# Patient Record
Sex: Male | Born: 1975 | Race: Black or African American | Hispanic: No | Marital: Married | Smoking: Never smoker
Health system: Southern US, Community
[De-identification: ages and names within clinical notes are randomized; demographics above are authoritative.]

## PROBLEM LIST (undated history)

## (undated) DIAGNOSIS — I10 Essential (primary) hypertension: Secondary | ICD-10-CM

## (undated) DIAGNOSIS — A6000 Herpesviral infection of urogenital system, unspecified: Secondary | ICD-10-CM

## (undated) HISTORY — PX: OTHER SURGICAL HISTORY: SHX169

---

## 2014-09-09 ENCOUNTER — Encounter: Payer: Self-pay | Admitting: Gynecology

## 2014-09-09 ENCOUNTER — Ambulatory Visit
Admission: EM | Admit: 2014-09-09 | Discharge: 2014-09-09 | Disposition: A | Discharge status: Home / Self Care | Payer: Self-pay | Attending: Emergency Medicine | Admitting: Emergency Medicine

## 2014-09-09 DIAGNOSIS — Z0000 Encounter for general adult medical examination without abnormal findings: Secondary | ICD-10-CM

## 2014-09-09 DIAGNOSIS — Z029 Encounter for administrative examinations, unspecified: Secondary | ICD-10-CM

## 2014-09-09 LAB — DEPT OF TRANSP DIPSTICK, URINE (ARMC ONLY)
GLUCOSE, UA: NEGATIVE mg/dL
Protein, ur: NEGATIVE mg/dL
Specific Gravity, Urine: 1.02 (ref 1.005–1.030)

## 2014-09-09 NOTE — Discharge Instructions (Signed)
Low-Sodium Eating Plan °Sodium raises blood pressure and causes water to be held in the body. Getting less sodium from food will help lower your blood pressure, reduce any swelling, and protect your heart, liver, and kidneys. We get sodium by adding salt (sodium chloride) to food. Most of our sodium comes from canned, boxed, and frozen foods. Restaurant foods, fast foods, and pizza are also very high in sodium. Even if you take medicine to lower your blood pressure or to reduce fluid in your body, getting less sodium from your food is important. °WHAT IS MY PLAN? °Most people should limit their sodium intake to 2,300 mg a day. Your health care provider recommends that you limit your sodium intake to __________ a day.  °WHAT DO I NEED TO KNOW ABOUT THIS EATING PLAN? °For the low-sodium eating plan, you will follow these general guidelines: °· Choose foods with a % Daily Value for sodium of less than 5% (as listed on the food label).   °· Use salt-free seasonings or herbs instead of table salt or sea salt.   °· Check with your health care provider or pharmacist before using salt substitutes.   °· Eat fresh foods. °· Eat more vegetables and fruits. °· Limit canned vegetables. If you do use them, rinse them well to decrease the sodium.   °· Limit cheese to 1 oz (28 g) per day.    °· Eat lower-sodium products, often labeled as "lower sodium" or "no salt added." °· Avoid foods that contain monosodium glutamate (MSG). MSG is sometimes added to Chinese food and some canned foods.   °· Check food labels (Nutrition Facts labels) on foods to learn how much sodium is in one serving. °· Eat more home-cooked food and less restaurant, buffet, and fast food.  °· When eating at a restaurant, ask that your food be prepared with less salt or none, if possible.   °HOW DO I READ FOOD LABELS FOR SODIUM INFORMATION? °The Nutrition Facts label lists the amount of sodium in one serving of the food. If you eat more than one serving, you must  multiply the listed amount of sodium by the number of servings. °Food labels may also identify foods as: °· Sodium free--Less than 5 mg in a serving. °· Very low sodium--35 mg or less in a serving. °· Low sodium--140 mg or less in a serving. °· Light in sodium--50% less sodium in a serving. For example, if a food that usually has 300 mg of sodium is changed to become light in sodium, it will have 150 mg of sodium. °· Reduced sodium--25% less sodium in a serving. For example, if a food that usually has 400 mg of sodium is changed to reduced sodium, it will have 300 mg of sodium. °WHAT FOODS CAN I EAT? °Grains  °Low-sodium cereals, including oats, puffed wheat and rice, and shredded wheat cereals. Low-sodium crackers. Unsalted rice and pasta. Lower-sodium bread.  °Vegetables  °Frozen or fresh vegetables. Low-sodium or reduced-sodium canned vegetables. Low-sodium or reduced-sodium tomato sauce and paste. Low-sodium or reduced-sodium tomato and vegetable juices.  °Fruits  °Fresh, frozen, and canned fruit. Fruit juice.  °Meat and Other Protein Products  °Low-sodium canned tuna and salmon. Fresh or frozen meat, poultry, seafood, and fish. Lamb. Unsalted nuts. Dried beans, peas, and lentils without added salt. Unsalted canned beans. Homemade soups without salt. Eggs.  °Dairy  °Milk. Soy milk. Ricotta cheese. Low-sodium or reduced-sodium cheeses. Yogurt.  °Condiments  °Fresh and dried herbs and spices. Salt-free seasonings. Onion and garlic powders. Low-sodium varieties of mustard and ketchup. Lemon juice.  °Fats and Oils   °  Reduced-sodium salad dressings. Unsalted butter.  Other Unsalted popcorn and pretzels.  The items listed above may not be a complete list of recommended foods or beverages. Contact your dietitian for more options. WHAT FOODS ARE NOT RECOMMENDED? Grains Instant hot cereals. Bread stuffing, pancake, and biscuit mixes. Croutons. Seasoned rice or pasta mixes. Noodle soup cups. Boxed or frozen  macaroni and cheese. Self-rising flour. Regular salted crackers. Vegetables Regular canned vegetables. Regular canned tomato sauce and paste. Regular tomato and vegetable juices. Frozen vegetables in sauces. Salted french fries. Olives. Rosita Fire. Relishes. Sauerkraut. Salsa. Meat and Other Protein Products Salted, canned, smoked, spiced, or pickled meats, seafood, or fish. Bacon, ham, sausage, hot dogs, corned beef, chipped beef, and packaged luncheon meats. Salt pork. Jerky. Pickled herring. Anchovies, regular canned tuna, and sardines. Salted nuts. Dairy Processed cheese and cheese spreads. Cheese curds. Blue cheese and cottage cheese. Buttermilk.  Condiments Onion and garlic salt, seasoned salt, table salt, and sea salt. Canned and packaged gravies. Worcestershire sauce. Tartar sauce. Barbecue sauce. Teriyaki sauce. Soy sauce, including reduced sodium. Steak sauce. Fish sauce. Oyster sauce. Cocktail sauce. Horseradish. Regular ketchup and mustard. Meat flavorings and tenderizers. Bouillon cubes. Hot sauce. Tabasco sauce. Marinades. Taco seasonings. Relishes. Fats and Oils Regular salad dressings. Salted butter. Margarine. Ghee. Bacon fat.  Other Potato and tortilla chips. Corn chips and puffs. Salted popcorn and pretzels. Canned or dried soups. Pizza. Frozen entrees and pot pies.  The items listed above may not be a complete list of foods and beverages to avoid. Contact your dietitian for more information. Document Released: 07/18/2001 Document Revised: 01/31/2013 Document Reviewed: 11/30/2012 [facility] Patient Information 2015 [place], [place]. This information is not intended to replace advice given to you by your health care provider. Make sure you discuss any questions you have with your health care provider. DASH Eating Plan DASH stands for "Dietary Approaches to Stop Hypertension." The DASH eating plan is a healthy eating plan that has been shown to reduce high blood pressure  (hypertension). Additional health benefits may include reducing the risk of type 2 diabetes mellitus, heart disease, and stroke. The DASH eating plan may also help with weight loss. WHAT DO I NEED TO KNOW ABOUT THE DASH EATING PLAN? For the DASH eating plan, you will follow these general guidelines:  Choose foods with a percent daily value for sodium of less than 5% (as listed on the food label).  Use salt-free seasonings or herbs instead of table salt or sea salt.  Check with your health care provider or pharmacist before using salt substitutes.  Eat lower-sodium products, often labeled as "lower sodium" or "no salt added."  Eat fresh foods.  Eat more vegetables, fruits, and low-fat dairy products.  Choose whole grains. Look for the word "whole" as the first word in the ingredient list.  Choose fish and skinless chicken or [place] more often than red meat. Limit fish, poultry, and meat to 6 oz (170 g) each day.  Limit sweets, desserts, sugars, and sugary drinks.  Choose heart-healthy fats.  Limit cheese to 1 oz (28 g) per day.  Eat more home-cooked food and less restaurant, buffet, and fast food.  Limit fried foods.  Cook foods using methods other than frying.  Limit canned vegetables. If you do use them, rinse them well to decrease the sodium.  When eating at a restaurant, ask that your food be prepared with less salt, or no salt if possible. WHAT FOODS CAN I EAT? Seek help from a dietitian for individual calorie  needs. Grains Whole grain or whole wheat bread. Brown rice. Whole grain or whole wheat pasta. Quinoa, bulgur, and whole grain cereals. Low-sodium cereals. Corn or whole wheat flour tortillas. Whole grain cornbread. Whole grain crackers. Low-sodium crackers. Vegetables Fresh or frozen vegetables (raw, steamed, roasted, or grilled). Low-sodium or reduced-sodium tomato and vegetable juices. Low-sodium or reduced-sodium tomato sauce and paste. Low-sodium or  reduced-sodium canned vegetables.  Fruits All fresh, canned (in natural juice), or frozen fruits. Meat and Other Protein Products Ground beef (85% or leaner), grass-fed beef, or beef trimmed of fat. Skinless chicken or [place]. Ground chicken or [place]. Pork trimmed of fat. All fish and seafood. Eggs. Dried beans, peas, or lentils. Unsalted nuts and seeds. Unsalted canned beans. Dairy Low-fat dairy products, such as skim or 1% milk, 2% or reduced-fat cheeses, low-fat ricotta or cottage cheese, or plain low-fat yogurt. Low-sodium or reduced-sodium cheeses. Fats and Oils Tub margarines without trans fats. Light or reduced-fat mayonnaise and salad dressings (reduced sodium). Avocado. Safflower, olive, or canola oils. Natural peanut or almond butter. Other Unsalted popcorn and pretzels. The items listed above may not be a complete list of recommended foods or beverages. Contact your dietitian for more options. WHAT FOODS ARE NOT RECOMMENDED? Grains White bread. White pasta. White rice. Refined cornbread. Bagels and croissants. Crackers that contain trans fat. Vegetables Creamed or fried vegetables. Vegetables in a cheese sauce. Regular canned vegetables. Regular canned tomato sauce and paste. Regular tomato and vegetable juices. Fruits Dried fruits. Canned fruit in light or heavy syrup. Fruit juice. Meat and Other Protein Products Fatty cuts of meat. Ribs, chicken wings, bacon, sausage, bologna, salami, chitterlings, fatback, hot dogs, bratwurst, and packaged luncheon meats. Salted nuts and seeds. Canned beans with salt. Dairy Whole or 2% milk, cream, half-and-half, and cream cheese. Whole-fat or sweetened yogurt. Full-fat cheeses or blue cheese. Nondairy creamers and whipped toppings. Processed cheese, cheese spreads, or cheese curds. Condiments Onion and garlic salt, seasoned salt, table salt, and sea salt. Canned and packaged gravies. Worcestershire sauce. Tartar sauce. Barbecue sauce. Teriyaki  sauce. Soy sauce, including reduced sodium. Steak sauce. Fish sauce. Oyster sauce. Cocktail sauce. Horseradish. Ketchup and mustard. Meat flavorings and tenderizers. Bouillon cubes. Hot sauce. Tabasco sauce. Marinades. Taco seasonings. Relishes. Fats and Oils Butter, stick margarine, lard, shortening, ghee, and bacon fat. Coconut, palm kernel, or palm oils. Regular salad dressings. Other Pickles and olives. Salted popcorn and pretzels. The items listed above may not be a complete list of foods and beverages to avoid. Contact your dietitian for more information. WHERE CAN I FIND MORE INFORMATION? National Heart, [employer], and [employer] Institute: [employer] Document Released: 01/15/2011 Document Revised: 06/12/2013 Document Reviewed: 11/30/2012 [facility] Patient Information 2015 [place], [place]. This information is not intended to replace advice given to you by your health care provider. Make sure you discuss any questions you have with your health care provider.

## 2014-09-09 NOTE — ED Provider Notes (Signed)
CSN: [id number]     Arrival date & time 09/09/14  0802 History   First MD Initiated Contact with Patient 09/09/14 [phone]     Chief Complaint  Patient presents with  . 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 No history of BP elevation to his knowledge- mother and all maternal aunts have HTN   History reviewed. No pertinent past medical history. Past Surgical History  Procedure Laterality Date  . Widsom tooth extraction     No family history on file. History  Substance Use Topics  . Smoking status: Never Smoker   . Smokeless tobacco: Not on file  . Alcohol Use: Yes    Review of Systems Constitutional: No fever. No headaches Eyes: Has glasses-doesn't use to drive, has private DL without restriction  ENT:No sore throat. Cardiovascular:Negative for chest pain/palpitations Respiratory: Negative for shortness of breath Gastrointestinal: No abdominal pain. No nausea,vomiting, diarrhea Genitourinary: Negative for dysuria. Normal urination. Musculoskeletal: Negative for back pain. FROM extremities without pain Skin: Negative for rash Neurological: Negative for headache, focal weakness or numbness  Allergies  Review of patient's allergies indicates no known allergies.  Home Medications   Prior to Admission medications   Not on File   BP 158/93 mmHg  Pulse 69  Temp(Src) 98.3 F (36.8 C) (Oral)  Resp 20  Ht 6' (1.829 m)  Wt 287 lb (130.182 kg)  BMI 38.92 kg/m2  SpO2 97% Physical Exam   Constitutional -alert and oriented,well appearing and in no acute distress; overweight Head-atraumatic, normocephalic Eyes- conjunctiva normal, EOMI ,conjugate gaze- Acuity acceptable with glasses only Ears- canals and TMs neg Nose- no congestion  Mouth/throat-  mucous membranes moist ,oropharynx non-erythematous Neck- supple without glandular enlargement CV- regular rate, grossly normal heart sounds,  Resp-no distress, normal respiratory effort,clear to auscultation bilaterally GI- soft,non-tender,no distention, no scars GU- unremarkable, circumsized, no hernia noted MSK- no tender, normal ROM, all extremities, ambulatory, DTRs equal;  can toe walk, heel walk ,squat return without assistance. SLRs negative Neuro- normal speech and language, no gross focal neurological deficit appreciated, no gait instability, CNS WNL as tested Skin-warm,dry ,intact; no rash noted Psych-mood and affect grossly normal; speech and behavior grossly normal_.    Procedures (including critical care time) Labs Review Labs Reviewed  DEPT OF TRANSP DIPSTICK, URINE([facility] ONLY) - Abnormal; Notable for the following:    Hgb urine dipstick TRACE (*)    All other components within normal limits    Imaging Review No results found.  Have discussed elevated pressure noted . Patient very aware of dietary indiscretion since moving to the area. Reports weight gain- last knowledge 249 or so  Also has glasses but rarely wears them- Testing today reveals they are required at all times for DOT clearance Discussed with patient MDM   1. Physical exam    Limited clearance for 3 months for blood pressure management Needs to connect with PCP-- info given, has [employer] Encourage weight loss, salt restriction , DASH diet given  PLease refer to DOT paperwork  Scanned into system for additional information as needed    Rae Halsted, PA-C 09/09/14 1130

## 2014-09-09 NOTE — ED Notes (Signed)
DOT physical. 

## 2014-10-31 ENCOUNTER — Ambulatory Visit (INDEPENDENT_AMBULATORY_CARE_PROVIDER_SITE_OTHER): Payer: BLUE CROSS/BLUE SHIELD | Admitting: Family Medicine

## 2014-10-31 ENCOUNTER — Encounter: Payer: Self-pay | Admitting: Family Medicine

## 2014-10-31 VITALS — BP 130/102 | HR 86 | Ht 72.0 in | Wt 270.0 lb

## 2014-10-31 DIAGNOSIS — IMO0001 Reserved for inherently not codable concepts without codable children: Secondary | ICD-10-CM

## 2014-10-31 DIAGNOSIS — R030 Elevated blood-pressure reading, without diagnosis of hypertension: Secondary | ICD-10-CM | POA: Diagnosis not present

## 2014-10-31 DIAGNOSIS — Z23 Encounter for immunization: Secondary | ICD-10-CM | POA: Diagnosis not present

## 2014-10-31 DIAGNOSIS — A609 Anogenital herpesviral infection, unspecified: Secondary | ICD-10-CM | POA: Diagnosis not present

## 2014-10-31 DIAGNOSIS — E669 Obesity, unspecified: Secondary | ICD-10-CM | POA: Diagnosis not present

## 2014-10-31 DIAGNOSIS — A6002 Herpesviral infection of other male genital organs: Secondary | ICD-10-CM

## 2014-10-31 DIAGNOSIS — E66812 Obesity, class 2: Secondary | ICD-10-CM | POA: Insufficient documentation

## 2014-10-31 NOTE — Progress Notes (Signed)
Date:  10/31/2014   Name:  Manuel Ramirez   DOB:  [birth year]   MRN:  [id number]  PCP:  No PCP Per Patient    Chief Complaint: Hypertension   History of Present Illness:  This is a 40 y.o. male with elevated BP (158/93) at DOT PE 2 months ago, given 3 month license and DASH diet, has increased exercise and lost 17#, wants to avoid BP med. FH HTN in mother but no FH CAD/DM/stroke. Takes Valtrex prn every 4 months or so, no other rx or OTC meds, vits or supplements. Tetanus unknown, declines flu, lipids unknown.  Review of Systems:  Review of Systems  Constitutional: Negative for fever and chills.  HENT: Negative for ear pain and sore throat.   Eyes: Negative for pain.  Respiratory: Negative for cough and shortness of breath.   Cardiovascular: Negative for chest pain and leg swelling.  Gastrointestinal: Negative for abdominal pain.  Endocrine: Negative for polyuria.  Genitourinary: Negative for difficulty urinating.  Neurological: Negative for syncope and headaches.    There are no active problems to display for this patient.   Prior to Admission medications   Not on File    No Known Allergies  Past Surgical History  Procedure Laterality Date  . Widsom tooth extraction      Social History  Substance Use Topics  . Smoking status: Never Smoker   . Smokeless tobacco: None  . Alcohol Use: Yes    Family History  Problem Relation Age of Onset  . Hypertension Mother     Medication list has been reviewed and updated.  Physical Examination: BP 130/102 mmHg  Pulse 86  Ht 6' (1.829 m)  Wt 270 lb (122.471 kg)  BMI 36.61 kg/m2  Physical Exam  Constitutional: He is oriented to person, place, and time. He appears well-developed and well-nourished.  HENT:  Head: Normocephalic and atraumatic.  Left Ear: External ear normal.  Nose: Nose normal.  Mouth/Throat: Oropharynx is clear and moist.  Eyes: Conjunctivae and EOM are normal. Pupils are equal, round, and reactive to  light.  Neck: Neck supple. No thyromegaly present.  Cardiovascular: Normal rate, regular rhythm and normal heart sounds.   Pulmonary/Chest: Effort normal and breath sounds normal.  Abdominal: Soft. He exhibits no distension and no mass. There is no tenderness.  Musculoskeletal: He exhibits no edema.  Lymphadenopathy:    He has no cervical adenopathy.  Neurological: He is alert and oriented to person, place, and time. Coordination normal.  Skin: Skin is warm and dry.  Psychiatric: He has a normal mood and affect. His behavior is normal.  Nursing note and vitals reviewed.   Assessment and Plan:  1. Elevated BP Systolic improved, encouraged continued exercise/weight loss/DASH diet, check labs, wife (CNA) to check BP's at home, RTO 2 weeks, consider BP med then, needs controlled by end of October for DOT - Comprehensive metabolic panel - CBC  2. Obesity, Class II, BMI 35-39.9 Continue weight loss - Lipid Profile - TSH  3. Genital HSV Continue Valtrex prn  4. Need for tetanus imm Tdap today  Return in about 2 weeks (around 11/14/2014).  Dionne Ano. Kingsley Spittle MD [facility]  10/31/2014

## 2014-11-01 LAB — LIPID PANEL
Chol/HDL Ratio: 5 ratio units (ref 0.0–5.0)
Cholesterol, Total: 206 mg/dL — ABNORMAL HIGH (ref 100–199)
HDL: 41 mg/dL (ref >39)
LDL CALC: 144 mg/dL — AB (ref 0–99)
Triglycerides: 105 mg/dL (ref 0–149)
VLDL CHOLESTEROL CAL: 21 mg/dL (ref 5–40)

## 2014-11-01 LAB — COMPREHENSIVE METABOLIC PANEL
ALT: 17 IU/L (ref 0–44)
AST: 17 IU/L (ref 0–40)
Albumin/Globulin Ratio: 1.5 (ref 1.1–2.5)
Albumin: 4.5 g/dL (ref 3.5–5.5)
Alkaline Phosphatase: 71 IU/L (ref 39–117)
BUN/Creatinine Ratio: 13 (ref 9–20)
BUN: 15 mg/dL (ref 6–24)
Bilirubin Total: 0.4 mg/dL (ref 0.0–1.2)
CALCIUM: 9.8 mg/dL (ref 8.7–10.2)
CO2: 26 mmol/L (ref 18–29)
CREATININE: 1.17 mg/dL (ref 0.76–1.27)
Chloride: 97 mmol/L (ref 97–108)
GFR calc Af Amer: 90 mL/min/{1.73_m2} (ref >59)
GFR, EST NON AFRICAN AMERICAN: 78 mL/min/{1.73_m2} (ref >59)
Globulin, Total: 3 g/dL (ref 1.5–4.5)
Glucose: 92 mg/dL (ref 65–99)
Potassium: 4.4 mmol/L (ref 3.5–5.2)
Sodium: 140 mmol/L (ref 134–144)
Total Protein: 7.5 g/dL (ref 6.0–8.5)

## 2014-11-01 LAB — CBC
HEMATOCRIT: 48 % (ref 37.5–51.0)
HEMOGLOBIN: 16.7 g/dL (ref 12.6–17.7)
MCH: 30 pg (ref 26.6–33.0)
MCHC: 34.8 g/dL (ref 31.5–35.7)
MCV: 86 fL (ref 79–97)
Platelets: 275 10*3/uL (ref 150–379)
RBC: 5.56 x10E6/uL (ref 4.14–5.80)
RDW: 14.6 % (ref 12.3–15.4)
WBC: 3.9 10*3/uL (ref 3.4–10.8)

## 2014-11-01 LAB — TSH: TSH: 2.88 u[IU]/mL (ref 0.450–4.500)

## 2014-11-16 ENCOUNTER — Ambulatory Visit: Payer: Self-pay | Admitting: Family Medicine

## 2014-11-16 ENCOUNTER — Ambulatory Visit (INDEPENDENT_AMBULATORY_CARE_PROVIDER_SITE_OTHER): Payer: BLUE CROSS/BLUE SHIELD | Admitting: Family Medicine

## 2014-11-16 ENCOUNTER — Encounter: Payer: Self-pay | Admitting: Family Medicine

## 2014-11-16 VITALS — BP 130/100 | HR 64 | Ht 72.0 in | Wt 264.0 lb

## 2014-11-16 DIAGNOSIS — E669 Obesity, unspecified: Secondary | ICD-10-CM | POA: Diagnosis not present

## 2014-11-16 DIAGNOSIS — I10 Essential (primary) hypertension: Secondary | ICD-10-CM

## 2014-11-16 DIAGNOSIS — E785 Hyperlipidemia, unspecified: Secondary | ICD-10-CM | POA: Diagnosis not present

## 2014-11-16 MED ORDER — LISINOPRIL-HYDROCHLOROTHIAZIDE 10-12.5 MG PO TABS: 1.0000 | ORAL_TABLET | Freq: Every day | ORAL | Status: DC

## 2014-11-16 NOTE — Progress Notes (Signed)
Date:  11/16/2014   Name:  Manuel Ramirez   DOB:  September 09, 1975   MRN:  [id number]  PCP:  No PCP Per Patient    Chief Complaint: Hypertension   History of Present Illness:  This is a 39 y.o. male seen in f/u for elevated BP, has been following DASH diet and exercising more, has lost 6# but wife (CNA) says BP still up at home. Labs ok except mild HLD.  Review of Systems:  Review of Systems  Respiratory: Negative for shortness of breath.   Cardiovascular: Negative for chest pain and leg swelling.    Patient Active Problem List   Diagnosis Date Noted  . Essential hypertension 11/16/2014  . Hyperlipidemia 11/16/2014  . 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 Obesity, Class II, BMI 35-39.9 10/31/2014    Prior to Admission medications   Medication Sig Start Date End Date Taking? Authorizing Provider  lisinopril-hydrochlorothiazide (PRINZIDE,ZESTORETIC) 10-12.5 MG tablet Take 1 tablet by mouth daily. 11/16/14   Schuyler Amor, MD    No Known Allergies  Past Surgical History  Procedure Laterality Date  . Widsom tooth extraction      Social History  Substance Use Topics  . Smoking status: Never Smoker   . Smokeless tobacco: None  . Alcohol Use: 0.0 oz/week    0 Standard drinks or equivalent per week    Family History  Problem Relation Age of Onset  . Hypertension Mother     Medication list has been reviewed and updated.  Physical Examination: BP 130/100 mmHg  Pulse 64  Ht 6' (1.829 m)  Wt 264 lb (119.75 kg)  BMI 35.80 kg/m2  Physical Exam  Constitutional: He appears well-developed and well-nourished.  Cardiovascular: Normal rate, regular rhythm and normal heart sounds.   Pulmonary/Chest: Effort normal and breath sounds normal.  Musculoskeletal: He exhibits no edema.  Neurological: He is alert.  Skin: Skin is warm and dry.  Psychiatric: He has a normal mood and affect. His behavior is normal.  Nursing note and vitals reviewed.   Assessment and Plan:  1.  Essential hypertension Begin Prinzide, continue DASH diet and weight loss, hope to be able to d/c med in future  2. Obesity, Class II, BMI 35-39.9 Cont increased exercise/weight loss  3. Hyperlipidemia Ratio shows average risk only, should improve with further weight loss  Return in about 1 week (around 11/23/2014).   Needs BP controlled for DOT exam at end of month.  Dionne Ano. Kingsley Spittle MD [facility]  11/16/2014

## 2014-11-19 ENCOUNTER — Ambulatory Visit
Admission: EM | Admit: 2014-11-19 | Discharge: 2014-11-19 | Disposition: A | Discharge status: Home / Self Care | Payer: Self-pay

## 2014-11-19 ENCOUNTER — Encounter: Payer: Self-pay | Admitting: *Deleted

## 2014-11-19 DIAGNOSIS — I10 Essential (primary) hypertension: Secondary | ICD-10-CM

## 2014-11-19 HISTORY — DX: Essential (primary) hypertension: I10

## 2014-11-19 NOTE — ED Notes (Signed)
Here for follow up for bp check in order to pass DOT physical.  Established patient of Dr Hollace Hayward now and has been placed on Lisinopril hctz 10.12.5.  Has taken this medicine since Friday.

## 2014-11-20 ENCOUNTER — Encounter: Payer: Self-pay | Admitting: Physician Assistant

## 2014-11-20 NOTE — ED Provider Notes (Signed)
CSN: [id number]     Arrival date & time 11/19/14  1420 History   None    Chief Complaint  Patient presents with  . Hypertension   (Consider location/radiation/quality/duration/timing/severity/associated sxs/prior Treatment) HPI  39 yo M returns for 3 months BP check for DOT. Has been cared for by Dr Hollace Hayward.  Started on Lisinopril and did a great job at weight loss [phone]). Pressure was still running a little high at last PCP check and HCTZ was added with excellent response.  Feels great-loves the weight loss and plans to continue. Has hopes he can get weight low enough to be able to stop medications. Strongly encouraged NOT to do so unless specifically guided by his PCP. Denies having any difficulty with his meds-just  Would like to make a super improvement. Encouraged.  Past Medical History  Diagnosis Date  . Hypertension    Past Surgical History  Procedure Laterality Date  . Widsom tooth extraction     Family History  Problem Relation Age of Onset  . Hypertension Mother    Social History  Substance Use Topics  . Smoking status: Never Smoker   . Smokeless tobacco: None  . Alcohol Use: 0.0 oz/week    0 Standard drinks or equivalent per week    Review of Systems  Allergies  Review of patient's allergies indicates no known allergies.  Home Medications   Prior to Admission medications   Medication Sig Start Date End Date Taking? Authorizing Provider  lisinopril-hydrochlorothiazide (PRINZIDE,ZESTORETIC) 10-12.5 MG tablet Take 1 tablet by mouth daily. 11/16/14   Schuyler Amor, MD   Meds Ordered and Administered this Visit  Medications - No data to display  BP 110/68 mmHg  Pulse 72  Temp(Src) 98.4 F (36.9 C) (Tympanic)  Resp 20  Ht 6' (1.829 m)  Wt 264 lb (119.75 kg)  BMI 35.80 kg/m2  SpO2 98% No data found.   Physical Exam  General: NAD, appears well  BP 110/68     264 lbs HEENT:conjugate gaze, conjunctiva clear, Neck: supple, Resp : CT A, bilat Card :  RRR Abd:  Not distended Neuro :face symmetric, :PERRLA, EOMI, tongue midline, shoulder adduction, good attention,recall-good memory,ambulatory without assistance   Specifics of complete exam refer to previous DOT visit. ED Course  Procedures (including critical care time)  Labs Review Labs Reviewed - No data to display  Imaging Review No results found.    MDM   1. Hypertension, well controlled    Paperwork for DOT reviewed, upgraded and submitted.  DOT  1 year certificate   Expires 09/09/2015  Must wear glasses while driving  Diagnosis and treatment discussed. Maintain f/u with Dr Hollace Hayward. Bring recent BPs with you to next year DOT appointment.   Questions fielded, expectations and recommendations reviewed. Discussed follow up and return parameters  Patient expresses understanding  and agrees to plan. Will return to [facility] with questions, concerns or exacerbation.     Rae Halsted, PA-C 11/20/14 [phone]

## 2014-11-27 ENCOUNTER — Ambulatory Visit: Payer: Self-pay | Admitting: Family Medicine

## 2015-02-20 ENCOUNTER — Other Ambulatory Visit: Payer: Self-pay

## 2015-02-20 ENCOUNTER — Telehealth: Payer: Self-pay

## 2015-02-20 MED ORDER — LISINOPRIL-HYDROCHLOROTHIAZIDE 10-12.5 MG PO TABS: 1.0000 | ORAL_TABLET | Freq: Every day | ORAL | Status: AC

## 2015-02-20 NOTE — Telephone Encounter (Signed)
Received fax from pharmacy requesting medication.  

## 2015-02-20 NOTE — Telephone Encounter (Signed)
Entered in error

## 2018-08-09 ENCOUNTER — Other Ambulatory Visit: Payer: Self-pay | Admitting: *Deleted

## 2018-08-09 DIAGNOSIS — Z20822 Contact with and (suspected) exposure to covid-19: Secondary | ICD-10-CM

## 2018-08-10 ENCOUNTER — Other Ambulatory Visit: Payer: Self-pay | Admitting: Pediatric Intensive Care

## 2018-08-16 LAB — NOVEL CORONAVIRUS, NAA: SARS-CoV-2, NAA: NOT DETECTED

## 2019-09-08 ENCOUNTER — Other Ambulatory Visit: Payer: Self-pay

## 2019-09-08 ENCOUNTER — Ambulatory Visit (HOSPITAL_COMMUNITY)
Admission: EM | Admit: 2019-09-08 | Discharge: 2019-09-08 | Disposition: A | Discharge status: Home / Self Care | Payer: BC Managed Care – PPO | Attending: Emergency Medicine | Admitting: Emergency Medicine

## 2019-09-08 ENCOUNTER — Encounter (HOSPITAL_COMMUNITY): Payer: Self-pay

## 2019-09-08 DIAGNOSIS — Z6839 Body mass index (BMI) 39.0-39.9, adult: Secondary | ICD-10-CM | POA: Diagnosis not present

## 2019-09-08 DIAGNOSIS — A6002 Herpesviral infection of other male genital organs: Secondary | ICD-10-CM | POA: Insufficient documentation

## 2019-09-08 DIAGNOSIS — R509 Fever, unspecified: Secondary | ICD-10-CM | POA: Diagnosis not present

## 2019-09-08 DIAGNOSIS — E669 Obesity, unspecified: Secondary | ICD-10-CM | POA: Diagnosis not present

## 2019-09-08 DIAGNOSIS — Z8249 Family history of ischemic heart disease and other diseases of the circulatory system: Secondary | ICD-10-CM | POA: Diagnosis not present

## 2019-09-08 DIAGNOSIS — Z20822 Contact with and (suspected) exposure to covid-19: Secondary | ICD-10-CM | POA: Insufficient documentation

## 2019-09-08 DIAGNOSIS — Z79899 Other long term (current) drug therapy: Secondary | ICD-10-CM | POA: Insufficient documentation

## 2019-09-08 DIAGNOSIS — E785 Hyperlipidemia, unspecified: Secondary | ICD-10-CM | POA: Diagnosis not present

## 2019-09-08 DIAGNOSIS — I10 Essential (primary) hypertension: Secondary | ICD-10-CM | POA: Diagnosis not present

## 2019-09-08 DIAGNOSIS — Z791 Long term (current) use of non-steroidal anti-inflammatories (NSAID): Secondary | ICD-10-CM | POA: Insufficient documentation

## 2019-09-08 HISTORY — DX: Herpesviral infection of urogenital system, unspecified: A60.00

## 2019-09-08 LAB — COMPREHENSIVE METABOLIC PANEL
ALT: 25 U/L (ref 0–44)
AST: 29 U/L (ref 15–41)
Albumin: 3.4 g/dL — ABNORMAL LOW (ref 3.5–5.0)
Alkaline Phosphatase: 53 U/L (ref 38–126)
Anion gap: 9 (ref 5–15)
BUN: 11 mg/dL (ref 6–20)
CO2: 26 mmol/L (ref 22–32)
Calcium: 8.3 mg/dL — ABNORMAL LOW (ref 8.9–10.3)
Chloride: 101 mmol/L (ref 98–111)
Creatinine, Ser: 1.49 mg/dL — ABNORMAL HIGH (ref 0.61–1.24)
GFR calc Af Amer: >60 mL/min (ref >60)
GFR calc non Af Amer: 56 mL/min — ABNORMAL LOW (ref >60)
Glucose, Bld: 113 mg/dL — ABNORMAL HIGH (ref 70–99)
Potassium: 4.2 mmol/L (ref 3.5–5.1)
Sodium: 136 mmol/L (ref 135–145)
Total Bilirubin: 0.8 mg/dL (ref 0.3–1.2)
Total Protein: 7.5 g/dL (ref 6.5–8.1)

## 2019-09-08 LAB — CBC WITH DIFFERENTIAL/PLATELET
Abs Immature Granulocytes: 0.01 10*3/uL (ref 0.00–0.07)
Basophils Absolute: 0 10*3/uL (ref 0.0–0.1)
Basophils Relative: 1 %
Eosinophils Absolute: 0 10*3/uL (ref 0.0–0.5)
Eosinophils Relative: 1 %
HCT: 50.5 % (ref 39.0–52.0)
Hemoglobin: 16.6 g/dL (ref 13.0–17.0)
Immature Granulocytes: 0 %
Lymphocytes Relative: 16 %
Lymphs Abs: 0.9 10*3/uL (ref 0.7–4.0)
MCH: 30 pg (ref 26.0–34.0)
MCHC: 32.9 g/dL (ref 30.0–36.0)
MCV: 91.2 fL (ref 80.0–100.0)
Monocytes Absolute: 1 10*3/uL (ref 0.1–1.0)
Monocytes Relative: 17 %
Neutro Abs: 3.7 10*3/uL (ref 1.7–7.7)
Neutrophils Relative %: 65 %
Platelets: 191 10*3/uL (ref 150–400)
RBC: 5.54 MIL/uL (ref 4.22–5.81)
RDW: 13 % (ref 11.5–15.5)
WBC: 5.7 10*3/uL (ref 4.0–10.5)
nRBC: 0 % (ref 0.0–0.2)

## 2019-09-08 LAB — POCT URINALYSIS DIP (DEVICE)
Glucose, UA: 100 mg/dL — AB
Leukocytes,Ua: NEGATIVE
Nitrite: NEGATIVE
Protein, ur: 100 mg/dL — AB
Specific Gravity, Urine: 1.025 (ref 1.005–1.030)
Urobilinogen, UA: 1 mg/dL (ref 0.0–1.0)
pH: 6 (ref 5.0–8.0)

## 2019-09-08 LAB — SARS CORONAVIRUS 2 (TAT 6-24 HRS): SARS Coronavirus 2: NEGATIVE

## 2019-09-08 LAB — CBG MONITORING, ED: Glucose-Capillary: 126 mg/dL — ABNORMAL HIGH (ref 70–99)

## 2019-09-08 NOTE — ED Triage Notes (Addendum)
Patient is here today with complaints of fever that began about two days ago. Patient states he has two home COVID19 test that resulted Negative. Patient states his urine has an odor to it as well for the past two days.

## 2019-09-08 NOTE — Discharge Instructions (Signed)
Your urine doesn't indicate an obvious infection, I have sent it to be cultured to confirm this.  Your blood sugar is fine today, but your primary care provider may collect follow up testing to confirm this.  Continue with tylenol or ibuprofen for pain body aches or fevers Increase your water intake. Gatorade or powerade as well. Limit caffeine  I will call you if I am worried about your lab testing.  If you don't hear from me they are unremarkable.  Please self isolate until your covid test comes back. It will be on your mychart.  If worsening, please go to the ER.  Follow up with your PCP next week for recheck

## 2019-09-09 LAB — URINE CULTURE: Culture: NO GROWTH

## 2019-09-09 NOTE — ED Provider Notes (Signed)
[facility]    CSN: [id number] Arrival date & time: 09/08/19  1634      History   Chief Complaint Chief Complaint  Patient presents with   Fever    HPI Manuel Ramirez is a 44 y.o. male.   Manuel Ramirez presents with complaints of fevers (up to 102), body aches, chills, headache, rare cough, two episodes of diarrhea, and odor with urination/ darker urine, which started 3 days ago. Also with fatigue. Some decreased appetite. No nausea or vomiting. Has been drinking a lot of energy drinks due to fatigue. No dizziness.No known ill contacts. Has gotten vaccinated for covid-19. No abdominal pain. No history of diabetes. He is a [employer] architect. Sexually active with his wife only, no concern for stds. No rash. Took advil 5 hours prior to arrival.    ROS per HPI, negative if not otherwise mentioned.      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 Authorizing Provider  valACYclovir (VALTREX) 500 MG tablet Take 500 mg by mouth as directed. 12/14/17  Yes [provider]  diclofenac (VOLTAREN) 75 MG EC tablet Take 75 mg by mouth 2 (two) times daily. 06/17/19   [provider]  lisinopril-hydrochlorothiazide (PRINZIDE,ZESTORETIC) 10-12.5 MG tablet Take 1 tablet by mouth daily. 02/20/15   Schuyler Amor, MD    Family History Family History  Problem Relation Age of Onset   Hypertension Mother     Social History Social History   Tobacco Use   Smoking status: Never Smoker   Smokeless tobacco: Never Used  Building services engineer  Use: Never used  Substance Use Topics   Alcohol use: Yes    Alcohol/week: 0.0 standard drinks   Drug use: No     Allergies   Patient has no known allergies.   Review of Systems Review of Systems   Physical Exam Triage Vital Signs ED Triage Vitals  Enc Vitals Group     BP 09/08/19 1805 125/79     Pulse Rate 09/08/19 1805 93     Resp 09/08/19 1805 18     Temp 09/08/19 1805 99.5 F (37.5 C)     Temp Source 09/08/19 1805 Oral     SpO2 09/08/19 1805 96 %     Weight --      Height --      Head Circumference --      Peak Flow --      Pain Score 09/08/19 1809 3     Pain Loc --      Pain Edu? --      Excl. in GC? --    No data found.  Updated Vital Signs BP 125/79 (BP Location: Left Arm)    Pulse 93    Temp 99.5 F (37.5 C) (Oral) Comment: Patient states last dose of Advil about 4-5 hours ago   Resp 18    SpO2 96%   Visual Acuity Right Eye Distance:   Left Eye Distance:   Bilateral Distance:  Right Eye Near:   Left Eye Near:    Bilateral Near:     Physical Exam Constitutional:      Appearance: He is well-developed.  HENT:     Head: Normocephalic and atraumatic.     Nose: No congestion or rhinorrhea.     Mouth/Throat:     Mouth: Mucous membranes are moist.  Cardiovascular:     Rate and Rhythm: Normal rate.  Pulmonary:     Effort: Pulmonary effort is normal.  Abdominal:     Tenderness: There is no abdominal tenderness. There is no right CVA tenderness, left CVA tenderness or guarding.  Skin:    General: Skin is warm and dry.  Neurological:     Mental Status: He is alert and oriented to person, place, and time.      UC Treatments / Results  Labs (all labs ordered are listed, but only abnormal results are displayed) Labs Reviewed  COMPREHENSIVE METABOLIC PANEL - Abnormal; Notable for the following components:      Result Value   Glucose, Bld 113 (*)    Creatinine, Ser 1.49 (*)    Calcium 8.3 (*)    Albumin 3.4 (*)    GFR calc non Af Amer 56  (*)    All other components within normal limits  POCT URINALYSIS DIP (DEVICE) - Abnormal; Notable for the following components:   Glucose, UA 100 (*)    Bilirubin Urine SMALL (*)    Ketones, ur TRACE (*)    Hgb urine dipstick SMALL (*)    Protein, ur 100 (*)    All other components within normal limits  CBG MONITORING, ED - Abnormal; Notable for the following components:   Glucose-Capillary 126 (*)    All other components within normal limits  URINE CULTURE  SARS CORONAVIRUS 2 (TAT 6-24 HRS)  CBC WITH DIFFERENTIAL/PLATELET    EKG   Radiology No results found.  Procedures Procedures (including critical care time)  Medications Ordered in UC Medications - No data to display  Initial Impression / Assessment and Plan / UC Course  I have reviewed the triage vital signs and the nursing notes.  Pertinent labs & imaging results that were available during my care of the patient were reviewed by me and considered in my medical decision making (see chart for details).     Urine without indication of infection on dip, culture obtained to confirm this. Basic labs collected, some slight elevation to creatinine- he endorses drinking energy drinks frequently and not much water, I suspect hydration should improve this. Close follow up for recheck recommended. Cbc normal which is reassuring. No obvious source of fevers at this time. Return precautions provided. Patient verbalized understanding and agreeable to plan.   Final Clinical Impressions(s) / UC Diagnoses   Final diagnoses:  Febrile illness     Discharge Instructions     Your urine doesn't indicate an obvious infection, I have sent it to be cultured to confirm this.  Your blood sugar is fine today, but your primary care provider may collect follow up testing to confirm this.  Continue with tylenol or ibuprofen for pain body aches or fevers Increase your water intake. Gatorade or powerade as well. Limit caffeine  I will call you  if I am worried about your lab testing.  If you don't hear from me they are unremarkable.  Please self isolate until your covid test comes back. It will be on your mychart.  If worsening, please go to the ER.  Follow up with your PCP next week for recheck   ED Prescriptions    None     PDMP not reviewed this encounter.   Georgetta Haber, NP 09/09/19 2240

## 2019-09-14 ENCOUNTER — Other Ambulatory Visit: Payer: Self-pay

## 2019-09-14 ENCOUNTER — Emergency Department: Payer: BC Managed Care – PPO

## 2019-09-14 DIAGNOSIS — R509 Fever, unspecified: Secondary | ICD-10-CM | POA: Diagnosis present

## 2019-09-14 DIAGNOSIS — R05 Cough: Secondary | ICD-10-CM | POA: Diagnosis not present

## 2019-09-14 DIAGNOSIS — R531 Weakness: Secondary | ICD-10-CM | POA: Insufficient documentation

## 2019-09-14 DIAGNOSIS — R0602 Shortness of breath: Secondary | ICD-10-CM | POA: Diagnosis not present

## 2019-09-14 DIAGNOSIS — Z5321 Procedure and treatment not carried out due to patient leaving prior to being seen by health care provider: Secondary | ICD-10-CM | POA: Diagnosis not present

## 2019-09-14 LAB — CBC WITH DIFFERENTIAL/PLATELET
Abs Immature Granulocytes: 0.15 10*3/uL — ABNORMAL HIGH (ref 0.00–0.07)
Basophils Absolute: 0.1 10*3/uL (ref 0.0–0.1)
Basophils Relative: 1 %
Eosinophils Absolute: 0.2 10*3/uL (ref 0.0–0.5)
Eosinophils Relative: 2 %
HCT: 42.3 % (ref 39.0–52.0)
Hemoglobin: 14.2 g/dL (ref 13.0–17.0)
Immature Granulocytes: 2 %
Lymphocytes Relative: 16 %
Lymphs Abs: 1.4 10*3/uL (ref 0.7–4.0)
MCH: 30.4 pg (ref 26.0–34.0)
MCHC: 33.6 g/dL (ref 30.0–36.0)
MCV: 90.6 fL (ref 80.0–100.0)
Monocytes Absolute: 0.9 10*3/uL (ref 0.1–1.0)
Monocytes Relative: 10 %
Neutro Abs: 5.8 10*3/uL (ref 1.7–7.7)
Neutrophils Relative %: 69 %
Platelets: 318 10*3/uL (ref 150–400)
RBC: 4.67 MIL/uL (ref 4.22–5.81)
RDW: 13.9 % (ref 11.5–15.5)
WBC: 8.5 10*3/uL (ref 4.0–10.5)
nRBC: 0 % (ref 0.0–0.2)

## 2019-09-14 LAB — COMPREHENSIVE METABOLIC PANEL
ALT: 48 U/L — ABNORMAL HIGH (ref 0–44)
AST: 43 U/L — ABNORMAL HIGH (ref 15–41)
Albumin: 3.1 g/dL — ABNORMAL LOW (ref 3.5–5.0)
Alkaline Phosphatase: 66 U/L (ref 38–126)
Anion gap: 9 (ref 5–15)
BUN: 14 mg/dL (ref 6–20)
CO2: 27 mmol/L (ref 22–32)
Calcium: 8.2 mg/dL — ABNORMAL LOW (ref 8.9–10.3)
Chloride: 102 mmol/L (ref 98–111)
Creatinine, Ser: 1 mg/dL (ref 0.61–1.24)
GFR calc Af Amer: >60 mL/min (ref >60)
GFR calc non Af Amer: >60 mL/min (ref >60)
Glucose, Bld: 110 mg/dL — ABNORMAL HIGH (ref 70–99)
Potassium: 4 mmol/L (ref 3.5–5.1)
Sodium: 138 mmol/L (ref 135–145)
Total Bilirubin: 0.6 mg/dL (ref 0.3–1.2)
Total Protein: 7.3 g/dL (ref 6.5–8.1)

## 2019-09-14 MED ORDER — SODIUM CHLORIDE 0.9% FLUSH: 3.0000 mL | Freq: Once | INTRAVENOUS | Status: DC

## 2019-09-14 NOTE — ED Triage Notes (Signed)
PT to ED via POV with complaints of fever, SHOB, cough, and weakness since Friday. PT has had a few negative covid tests since, but symptoms remain. No known covid exposure

## 2019-09-15 ENCOUNTER — Emergency Department
Admission: EM | Admit: 2019-09-15 | Discharge: 2019-09-15 | Disposition: A | Discharge status: Home / Self Care | Payer: BC Managed Care – PPO | Attending: Emergency Medicine | Admitting: Emergency Medicine

## 2019-09-16 NOTE — ED Notes (Signed)
Pt called regarding follow up care and cxr results, pt reports that he was seen elsewhere and started on an antibiotic

## 2021-07-09 IMAGING — CR DG CHEST 2V
1 series · 2 of 2 positions shown · non-contrast
Comparison: None.

CLINICAL DATA: Shortness of breath fever

EXAM:
CHEST - 2 VIEW

[Series 1: w chest pa · 0.14mm/px · 2 of 2 slices shown]
[im 1/2]
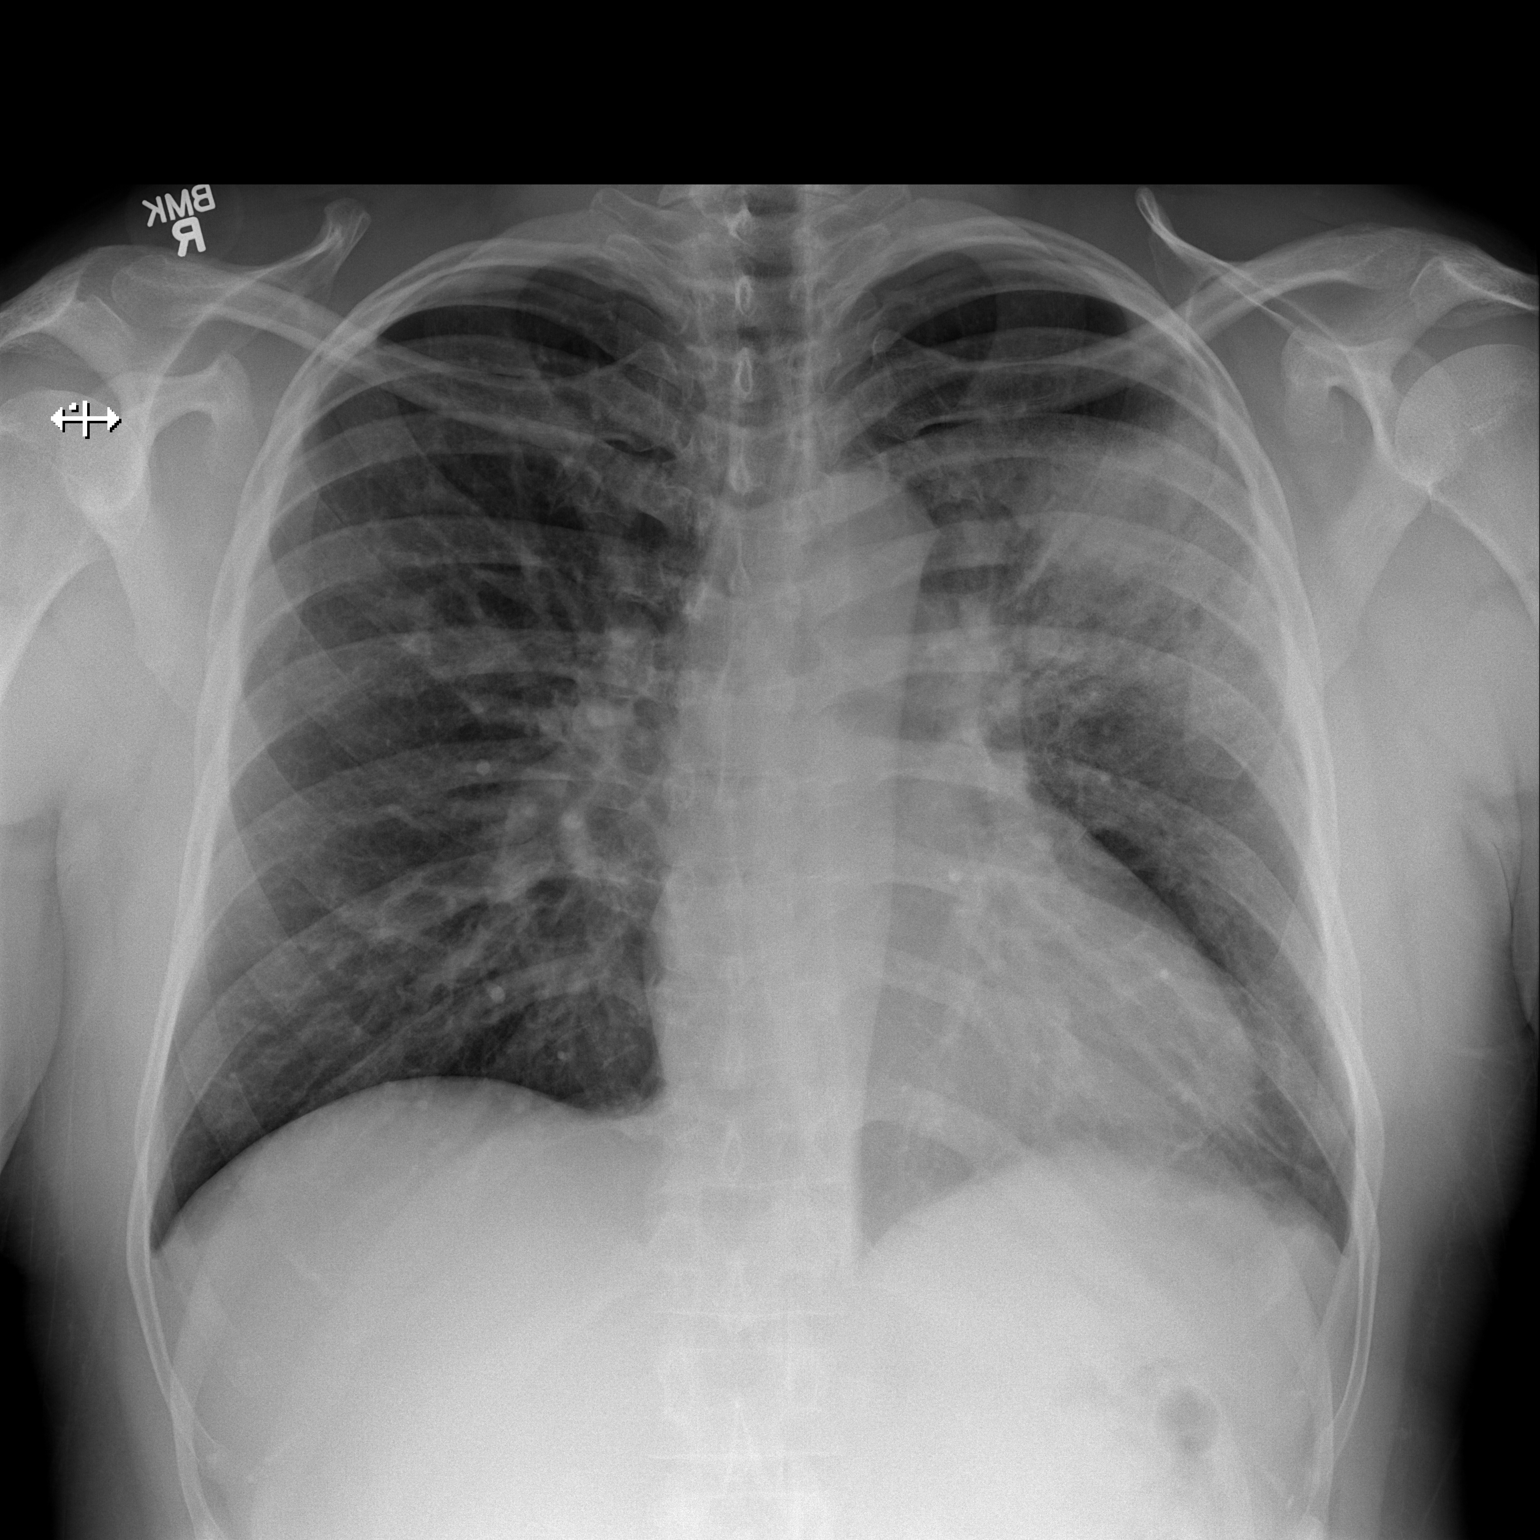
[im 2/2]
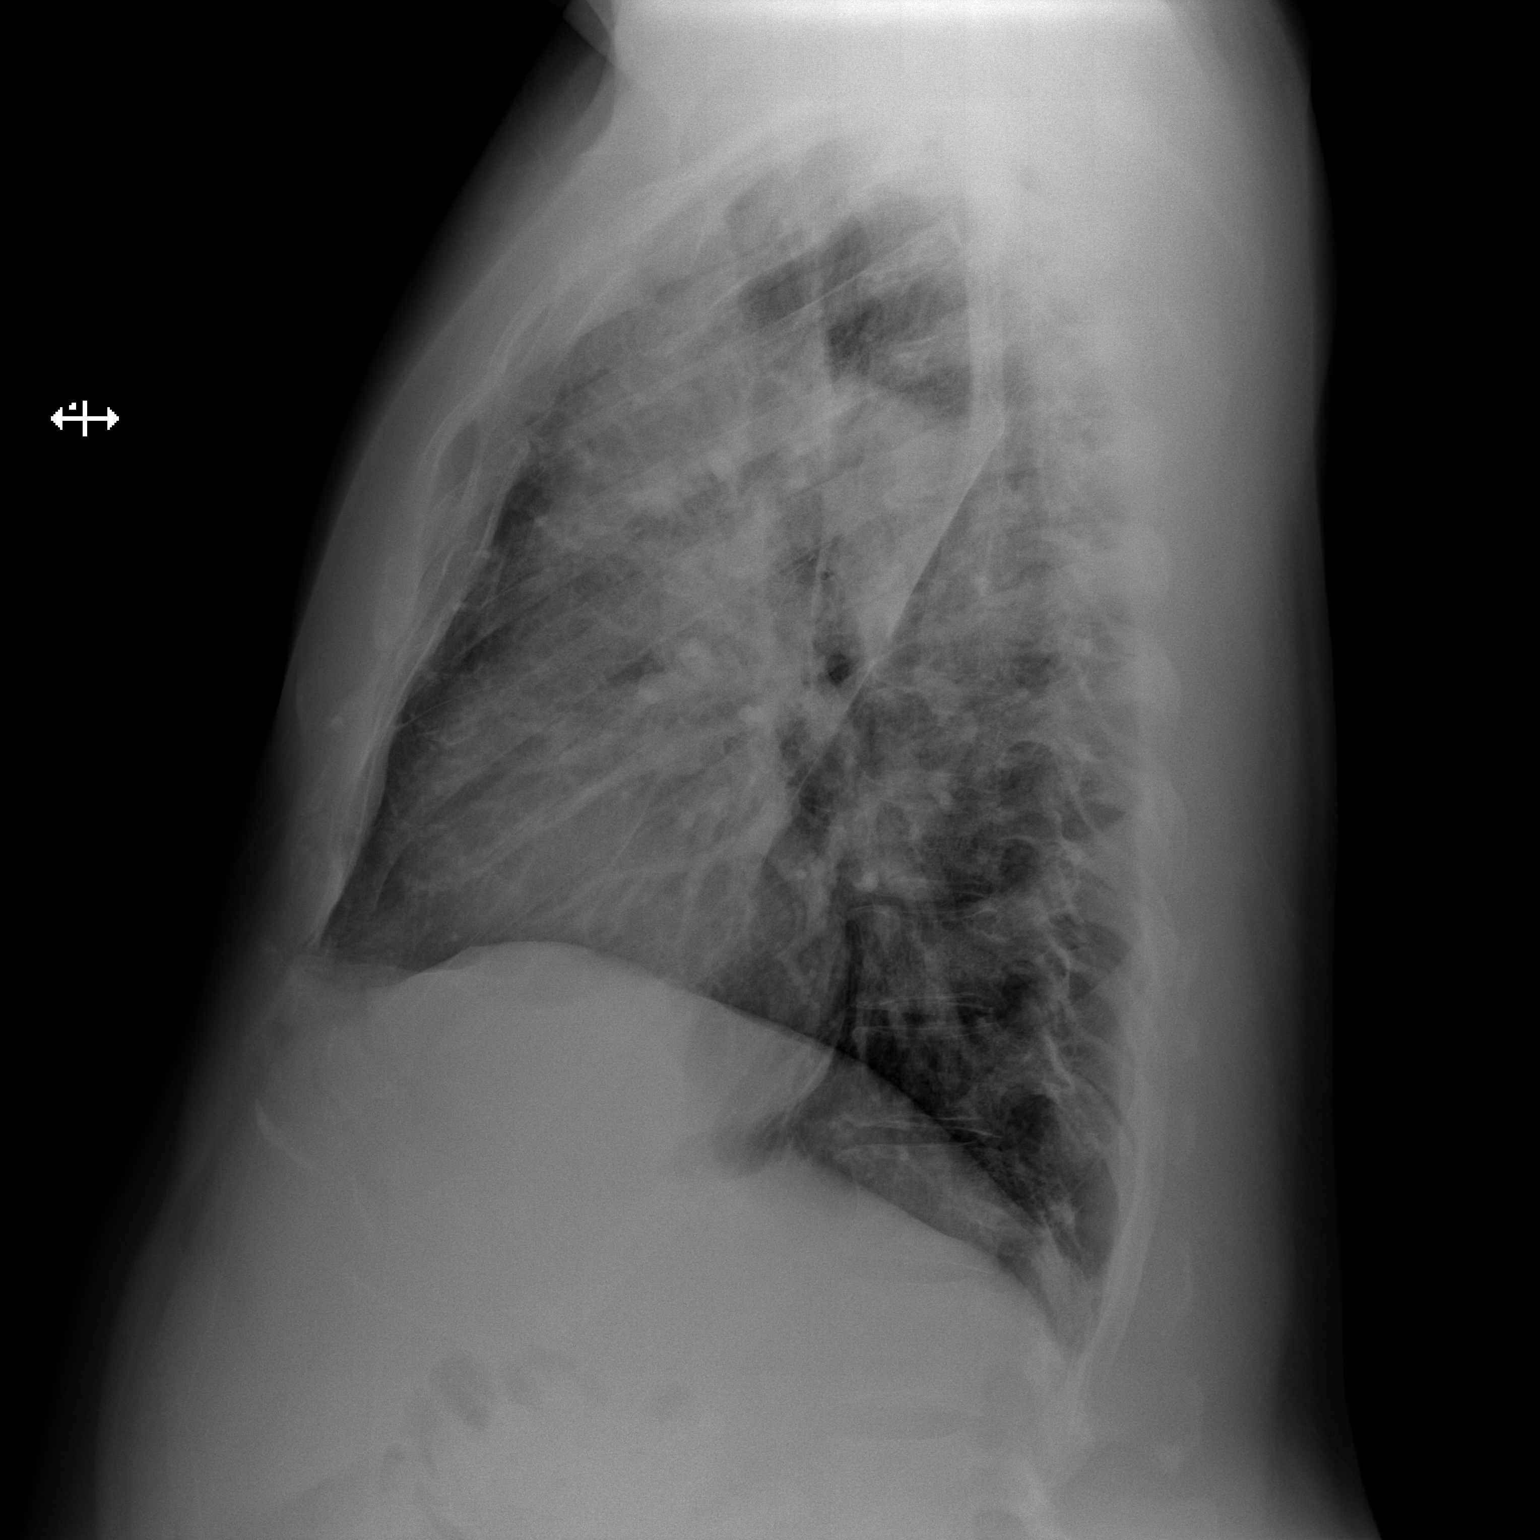

[2 of 2 positions shown; findings below may reference images not displayed]

FINDINGS: Right lung is grossly clear. Consolidation with surrounding
ground-glass density in the left upper lobe. Normal heart size. No
pneumothorax.
IMPRESSION: Findings suspicious for left upper lobe pneumonia. Imaging follow-up
to resolution is recommended.
